# Patient Record
Sex: Female | Born: 1955 | Race: White | Hispanic: No | Marital: Single | Smoking: Former smoker
Health system: Southern US, Community
[De-identification: ages and names within clinical notes are randomized; demographics above are authoritative.]

## PROBLEM LIST (undated history)

## (undated) DIAGNOSIS — I252 Old myocardial infarction: Secondary | ICD-10-CM

## (undated) DIAGNOSIS — E785 Hyperlipidemia, unspecified: Secondary | ICD-10-CM

## (undated) HISTORY — PX: APPENDECTOMY: SHX54

## (undated) HISTORY — DX: Old myocardial infarction: I25.2

## (undated) HISTORY — DX: Hyperlipidemia, unspecified: E78.5

## (undated) HISTORY — PX: ABDOMINAL HYSTERECTOMY: SHX81

---

## 2010-10-23 DIAGNOSIS — I252 Old myocardial infarction: Secondary | ICD-10-CM

## 2010-10-23 HISTORY — DX: Old myocardial infarction: I25.2

## 2014-09-28 ENCOUNTER — Encounter: Payer: Self-pay | Admitting: Physician Assistant

## 2014-09-28 ENCOUNTER — Ambulatory Visit (INDEPENDENT_AMBULATORY_CARE_PROVIDER_SITE_OTHER): Payer: BC Managed Care – PPO | Admitting: Physician Assistant

## 2014-09-28 VITALS — BP 112/75 | HR 85 | Temp 98.2°F | Ht 65.4 in | Wt 163.0 lb

## 2014-09-28 DIAGNOSIS — L989 Disorder of the skin and subcutaneous tissue, unspecified: Secondary | ICD-10-CM

## 2014-09-28 DIAGNOSIS — Z1239 Encounter for other screening for malignant neoplasm of breast: Secondary | ICD-10-CM

## 2014-09-28 DIAGNOSIS — E785 Hyperlipidemia, unspecified: Secondary | ICD-10-CM

## 2014-09-28 DIAGNOSIS — I252 Old myocardial infarction: Secondary | ICD-10-CM

## 2014-09-28 NOTE — Progress Notes (Signed)
    Patient presents to clinic today to establish care.  Acute Concerns: Patient complains of a skin lesion of upper back that has been present for several months.  Is not painful but will not heal. Endorses prior hx of skin cancer, although unsure what type.   Health Maintenance: Immunizations -- Declines flu shot. Colonoscopy -- Has not had.  Declines at present. Mammogram -- History of mass.  Overdue for follow-up mammogram.  Will need order and records from previous PCP. PAP -- s/p TAH  Past Medical History  Diagnosis Date  . MI, old 2012  . Hyperlipidemia     Past Surgical History  Procedure Laterality Date  . Abdominal hysterectomy    . Appendectomy      No current outpatient prescriptions on file prior to visit.   No current facility-administered medications on file prior to visit.    Allergies  Allergen Reactions  . Penicillins Hives    Family History  Problem Relation Age of Onset  . Uterine cancer Mother   . Hypertension Mother   . Hypertension Father     History   Social History  . Marital Status: Single    Spouse Name: N/A    Number of Children: N/A  . Years of Education: N/A   Occupational History  . Not on file.   Social History Main Topics  . Smoking status: Former Smoker -- 2.50 packs/day for 30 years    Types: Cigarettes    Quit date: 09/28/2001  . Smokeless tobacco: Never Used  . Alcohol Use: 1.2 oz/week    2 Not specified per week  . Drug Use: No  . Sexual Activity:    Partners: Male   Other Topics Concern  . Not on file   Social History Narrative   ROS Pertinent ROS are listed in HPI.  BP 112/75 mmHg  Pulse 85  Temp(Src) 98.2 F (36.8 C)  Ht 5' 5.4" (1.661 m)  Wt 163 lb (73.936 kg)  BMI 26.80 kg/m2  SpO2 97%  Physical Exam  Constitutional: She is oriented to person, place, and time and well-developed, well-nourished, and in no distress.  HENT:  Head: Normocephalic and atraumatic.  Eyes: Conjunctivae are normal.    Cardiovascular: Normal rate, regular rhythm, normal heart sounds and intact distal pulses.   Pulmonary/Chest: Effort normal and breath sounds normal. No respiratory distress. She has no wheezes. She has no rales. She exhibits no tenderness.  Neurological: She is alert and oriented to person, place, and time.  Skin: Skin is warm and dry.     Psychiatric: Affect normal.  Vitals reviewed.    Assessment/Plan: Skin lesion of back Concern for malignancy.  Will refer to Dermatology for biopsy and assessment.  Breast cancer screening History of lump of left breast, non-cancerous. Need previous imaging records so we can proceed with Diagnostic Mammogram.  Medical record request form filled and faxed.  Hyperlipidemia Patient-endorses history. States is controlled with diet.  S/p MI.  Start 81 mg ASA daily.  Return for CPE with fasting labs so lipids can be assessed.  History of MI (myocardial infarction) Begin 81 mg asa.  Patient to return for fasting labs to assess risks for repeat MI.  Will obtain records from Cardiology.

## 2014-09-28 NOTE — Progress Notes (Signed)
Pre visit review using our clinic review tool, if applicable. No additional management support is needed unless otherwise documented below in the visit note. 

## 2014-09-28 NOTE — Patient Instructions (Signed)
You will be contacted by Dermatology for an appointment.  I am trying to get you in with Dr. Charm BargesButler whom your husband sees.  You will also be contacted for a diagnostic mammogram.  We will need previous imaging records before [facility] will perform testing.  I will call you as soon as everything has been received and sent over.   Please start an 81 mg aspirin (baby aspirin) daily.  Please return in 1 month for a complete physical with fasting labs.

## 2014-09-30 ENCOUNTER — Encounter: Payer: Self-pay | Admitting: Physician Assistant

## 2014-09-30 DIAGNOSIS — Z1239 Encounter for other screening for malignant neoplasm of breast: Secondary | ICD-10-CM | POA: Insufficient documentation

## 2014-09-30 DIAGNOSIS — E785 Hyperlipidemia, unspecified: Secondary | ICD-10-CM | POA: Insufficient documentation

## 2014-09-30 DIAGNOSIS — I252 Old myocardial infarction: Secondary | ICD-10-CM | POA: Insufficient documentation

## 2014-09-30 DIAGNOSIS — L989 Disorder of the skin and subcutaneous tissue, unspecified: Secondary | ICD-10-CM | POA: Insufficient documentation

## 2014-09-30 NOTE — Assessment & Plan Note (Signed)
Patient-endorses history. States is controlled with diet.  S/p MI.  Start 81 mg ASA daily.  Return for CPE with fasting labs so lipids can be assessed.

## 2014-09-30 NOTE — Assessment & Plan Note (Signed)
Concern for malignancy.  Will refer to Dermatology for biopsy and assessment.

## 2014-09-30 NOTE — Assessment & Plan Note (Signed)
History of lump of left breast, non-cancerous. Need previous imaging records so we can proceed with Diagnostic Mammogram.  Medical record request form filled and faxed.

## 2014-09-30 NOTE — Assessment & Plan Note (Signed)
Begin 81 mg asa.  Patient to return for fasting labs to assess risks for repeat MI.  Will obtain records from Cardiology.

## 2014-11-23 ENCOUNTER — Ambulatory Visit (INDEPENDENT_AMBULATORY_CARE_PROVIDER_SITE_OTHER): Payer: BLUE CROSS/BLUE SHIELD | Admitting: Medical

## 2014-11-23 ENCOUNTER — Encounter: Payer: Self-pay | Admitting: Medical

## 2014-11-23 ENCOUNTER — Ambulatory Visit (HOSPITAL_BASED_OUTPATIENT_CLINIC_OR_DEPARTMENT_OTHER)
Admission: RE | Admit: 2014-11-23 | Discharge: 2014-11-23 | Disposition: A | Discharge status: Home / Self Care | Payer: BLUE CROSS/BLUE SHIELD | Source: Ambulatory Visit | Attending: Medical | Admitting: Medical

## 2014-11-23 VITALS — BP 130/88 | HR 86 | Temp 97.9°F | Ht 65.4 in | Wt 167.8 lb

## 2014-11-23 DIAGNOSIS — R0781 Pleurodynia: Secondary | ICD-10-CM | POA: Insufficient documentation

## 2014-11-23 DIAGNOSIS — Y939 Activity, unspecified: Secondary | ICD-10-CM | POA: Diagnosis not present

## 2014-11-23 MED ORDER — TRAMADOL HCL 50 MG PO TABS: 50.0000 mg | ORAL_TABLET | Freq: Three times a day (TID) | ORAL | Status: DC | PRN

## 2014-11-23 MED ORDER — DICLOFENAC SODIUM 75 MG PO TBEC: 75.0000 mg | DELAYED_RELEASE_TABLET | Freq: Two times a day (BID) | ORAL | Status: DC

## 2014-11-23 NOTE — Progress Notes (Signed)
Pre visit review using our clinic review tool, if applicable. No additional management support is needed unless otherwise documented below in the visit note. 

## 2014-11-23 NOTE — Patient Instructions (Addendum)
Your rib pain likely represent injury to the intercostal muscle strain but possible rib injury. Please get xray to day. Rx diclofenac and tramadol. I would advise reporting this injury and letting me fill out your modified work duty sheet so this length of this injury can be minimized and you can recover quickly.  Follow up in 10 days or as needed

## 2014-11-23 NOTE — Progress Notes (Signed)
   Subjective:    Patient ID: Victoria Waller, female    DOB: 1955-12-26, 59 y.o.   MRN: [id number]  HPI   Pt in with some rt rib region pain. She states she was moving large piece of carpet. She pulled it at work pulling the large piece of carpet.  Pt was informed to report it to workers comp. Benefits of doing do so and complications if she were to not report it.  At time she pulled carpet felt pop/tear sensation in rt side rib area.  Pt pain level at work is level 3/10. But when she moves certain ways pain is more severe   Review of Systems  Constitutional: Negative for fever, chills and fatigue.  Respiratory: Negative for cough, chest tightness, shortness of breath and wheezing.   Cardiovascular: Negative for chest pain.  Musculoskeletal:       Rt rib pain on movement. Pain on breathing.  Neurological: Negative for headaches.  Hematological: Negative for adenopathy. Bruises/bleeds easily.  Psychiatric/Behavioral: Negative for behavioral problems.   Past Medical History  Diagnosis Date  . MI, old 2012  . Hyperlipidemia     History   Social History  . Marital Status: Single    Spouse Name: N/A    Number of Children: N/A  . Years of Education: N/A   Occupational History  . Not on file.   Social History Main Topics  . Smoking status: Former Smoker -- 2.50 packs/day for 30 years    Types: Cigarettes    Quit date: 09/28/2001  . Smokeless tobacco: Never Used  . Alcohol Use: 1.2 oz/week    2 Not specified per week  . Drug Use: No  . Sexual Activity:    Partners: Male   Other Topics Concern  . Not on file   Social History Narrative    Past Surgical History  Procedure Laterality Date  . Abdominal hysterectomy    . Appendectomy      Family History  Problem Relation Age of Onset  . Uterine cancer Mother   . Hypertension Mother   . Hypertension Father     Allergies  Allergen Reactions  . Penicillins Hives    Current Outpatient Prescriptions on File Prior  to Visit  Medication Sig Dispense Refill  . aspirin 81 MG chewable tablet Chew by mouth daily.     No current facility-administered medications on file prior to visit.    BP 130/88 mmHg  Pulse 86  Temp(Src) 97.9 F (36.6 C) (Oral)  Ht 5' 5.4" (1.661 m)  Wt 167 lb 12.8 oz (76.114 kg)  BMI 27.59 kg/m2  SpO2 96%       Objective:   Physical Exam    General- No acute distress. Pleasant patient. Neck- Full range of motion, no jvd Lungs- Clear, even and unlabored. Heart- regular rate and rhythm. Neurologic- CNII- XII grossly intact. Thorax- Rt lower ribs, mid axillary region pain on palpation and on deep inspiration. NO bruising.        Assessment & Plan:  Front staff notified regarding pt injury taking place at work.

## 2014-11-23 NOTE — Assessment & Plan Note (Signed)
Your rib pain likely represent injury to the intercostal muscle strain but possible rib injury. Please get xray to day. Rx diclofenac and tramadol. I would advise reporting this injury and letting me fill out your modified work duty sheet so this length of this injury can be minimized and you can recover quickly.

## 2015-07-04 ENCOUNTER — Emergency Department (HOSPITAL_BASED_OUTPATIENT_CLINIC_OR_DEPARTMENT_OTHER): Payer: BLUE CROSS/BLUE SHIELD

## 2015-07-04 ENCOUNTER — Emergency Department (HOSPITAL_BASED_OUTPATIENT_CLINIC_OR_DEPARTMENT_OTHER)
Admission: EM | Admit: 2015-07-04 | Discharge: 2015-07-04 | Disposition: A | Discharge status: Home / Self Care | Payer: BLUE CROSS/BLUE SHIELD | Attending: Emergency Medicine | Admitting: Emergency Medicine

## 2015-07-04 ENCOUNTER — Encounter (HOSPITAL_BASED_OUTPATIENT_CLINIC_OR_DEPARTMENT_OTHER): Payer: Self-pay

## 2015-07-04 DIAGNOSIS — Y9289 Other specified places as the place of occurrence of the external cause: Secondary | ICD-10-CM | POA: Diagnosis not present

## 2015-07-04 DIAGNOSIS — Z87891 Personal history of nicotine dependence: Secondary | ICD-10-CM | POA: Diagnosis not present

## 2015-07-04 DIAGNOSIS — S8991XA Unspecified injury of right lower leg, initial encounter: Secondary | ICD-10-CM | POA: Insufficient documentation

## 2015-07-04 DIAGNOSIS — Z7982 Long term (current) use of aspirin: Secondary | ICD-10-CM | POA: Diagnosis not present

## 2015-07-04 DIAGNOSIS — Y998 Other external cause status: Secondary | ICD-10-CM | POA: Insufficient documentation

## 2015-07-04 DIAGNOSIS — I252 Old myocardial infarction: Secondary | ICD-10-CM | POA: Diagnosis not present

## 2015-07-04 DIAGNOSIS — W01198A Fall on same level from slipping, tripping and stumbling with subsequent striking against other object, initial encounter: Secondary | ICD-10-CM | POA: Insufficient documentation

## 2015-07-04 DIAGNOSIS — S79911A Unspecified injury of right hip, initial encounter: Secondary | ICD-10-CM | POA: Diagnosis not present

## 2015-07-04 DIAGNOSIS — Y9389 Activity, other specified: Secondary | ICD-10-CM | POA: Insufficient documentation

## 2015-07-04 DIAGNOSIS — Z8639 Personal history of other endocrine, nutritional and metabolic disease: Secondary | ICD-10-CM | POA: Insufficient documentation

## 2015-07-04 DIAGNOSIS — Z880 Allergy status to penicillin: Secondary | ICD-10-CM | POA: Insufficient documentation

## 2015-07-04 DIAGNOSIS — M25551 Pain in right hip: Secondary | ICD-10-CM

## 2015-07-04 DIAGNOSIS — S3991XA Unspecified injury of abdomen, initial encounter: Secondary | ICD-10-CM | POA: Diagnosis not present

## 2015-07-04 DIAGNOSIS — W19XXXA Unspecified fall, initial encounter: Secondary | ICD-10-CM

## 2015-07-04 MED ORDER — OXYCODONE-ACETAMINOPHEN 5-325 MG PO TABS
1.0000 | ORAL_TABLET | Freq: Once | ORAL | Status: DC
  Filled 2015-07-04: qty 1

## 2015-07-04 MED ORDER — HYDROCODONE-ACETAMINOPHEN 5-325 MG PO TABS: 1.0000 | ORAL_TABLET | ORAL | Status: DC | PRN

## 2015-07-04 MED ORDER — IBUPROFEN 600 MG PO TABS: 600.0000 mg | ORAL_TABLET | Freq: Three times a day (TID) | ORAL | Status: AC | PRN

## 2015-07-04 MED ORDER — IBUPROFEN 800 MG PO TABS
800.0000 mg | ORAL_TABLET | Freq: Once | ORAL | Status: AC
  Administered 2015-07-04: 800 mg via ORAL
  Filled 2015-07-04: qty 1

## 2015-07-04 NOTE — ED Notes (Signed)
Pt states she has been ambulating around the room without difficulty and is ready for discharge.

## 2015-07-04 NOTE — ED Notes (Signed)
rx x 2 given for vicodin and ibuprofen

## 2015-07-04 NOTE — ED Provider Notes (Signed)
CSN: [id number]     Arrival date & time 07/04/15  4098 History   First MD Initiated Contact with Patient 07/04/15 [phone]     Chief Complaint  Patient presents with  . Fall      HPI Patient presents to the emergency department after a fall today.  She tripped and fell forward.  She struck her right knee and right hip.  She reports pain in her right knee, right femur, right hip, right groin.  No head injury.  Denies neck pain.  Her pain is mild in severity.  She does not want anything for pain except for anti-inflammatories.  She denies chest pain shortness of breath.  She denies wrist pain.  Her pain is mild to moderate in severity at this time.  She feels better when she standing.   Past Medical History  Diagnosis Date  . MI, old 2012  . Hyperlipidemia    Past Surgical History  Procedure Laterality Date  . Abdominal hysterectomy    . Appendectomy     Family History  Problem Relation Age of Onset  . Uterine cancer Mother   . Hypertension Mother   . Hypertension Father    Social History  Substance Use Topics  . Smoking status: Former Smoker -- 2.50 packs/day for 30 years    Types: Cigarettes    Quit date: 09/28/2001  . Smokeless tobacco: Never Used  . Alcohol Use: 1.2 oz/week    2 Standard drinks or equivalent per week   OB History    No data available     Review of Systems  All other systems reviewed and are negative.     Allergies  Penicillins  Home Medications   Prior to Admission medications   Medication Sig Start Date End Date Taking? Authorizing Provider  aspirin 81 MG chewable tablet Chew by mouth daily. 09/30/14   Waldon Merl, PA-C   BP 114/67 mmHg  Pulse 60  Temp(Src) 98.3 F (36.8 C) (Oral)  Resp 18  Ht 5\' 6"  (1.676 m)  Wt 167 lb (75.751 kg)  BMI 26.97 kg/m2  SpO2 100% Physical Exam  Constitutional: She is oriented to person, place, and time. She appears well-developed and well-nourished. No distress.  HENT:  Head: Normocephalic and  atraumatic.  Eyes: EOM are normal.  Neck: Normal range of motion.  Cardiovascular: Normal rate, regular rhythm and normal heart sounds.   Pulmonary/Chest: Effort normal and breath sounds normal.  Abdominal: Soft. She exhibits no distension. There is no tenderness.  Musculoskeletal: Normal range of motion.  Pelvis is stable.  Normal pulses in right foot.  Normal range of motion of right knee and right ankle.  She does have tenderness of the lateral joint line on the right knee.  There is a small abrasion overlying the right anterior knee.  Patient without obvious deformity of the right lower extremity.  She does have pain is a mid shaft femur region without deformity or ecchymosis.  No lacerations.  She is able to arrange her right hip although she does have pain with internal/external rotation.  There is no shortening or rotation of the right lower extremity as compared to the left.  Neurological: She is alert and oriented to person, place, and time.  Skin: Skin is warm and dry.  Psychiatric: She has a normal mood and affect. Judgment normal.  Nursing note and vitals reviewed.   ED Course  Procedures (including critical care time) Labs Review Labs Reviewed - No data to display  Imaging Review Dg Knee Complete 4 Views Right  07/04/2015   CLINICAL DATA:  Fall, right knee pain  EXAM: RIGHT KNEE - COMPLETE 4+ VIEW  COMPARISON:  None.  FINDINGS: There is no evidence of fracture, dislocation, or joint effusion. There is no evidence of arthropathy or other focal bone abnormality. Soft tissues are unremarkable. Mild tricompartmental spurring. Bones are subjectively osteopenic.  IMPRESSION: No acute abnormality.   Electronically Signed   By: Christiana Pellant M.D.   On: 07/04/2015 10:00   Dg Hip Unilat With Pelvis 2-3 Views Right  07/04/2015   CLINICAL DATA:  Slipped and fell in driveway.  Pain.  EXAM: DG HIP (WITH OR WITHOUT PELVIS) 2-3V RIGHT  COMPARISON:  None.  FINDINGS: There is no evidence of hip  fracture or dislocation. There is no evidence of arthropathy or other focal bone abnormality.  IMPRESSION: Negative.   Electronically Signed   By: Elsie Stain M.D.   On: 07/04/2015 09:29   Dg Femur, Min 2 Views Right  07/04/2015   CLINICAL DATA:  Fall, right leg pain  EXAM: RIGHT FEMUR 2 VIEWS  COMPARISON:  None.  FINDINGS: There is no evidence of fracture or other focal bone lesions. Soft tissues are unremarkable. Bones are subjectively osteopenic.  IMPRESSION: Negative.   Electronically Signed   By: Christiana Pellant M.D.   On: 07/04/2015 10:00   I have personally reviewed and evaluated these images and lab results as part of my medical decision-making.   EKG Interpretation None      MDM   Final diagnoses:  Fall    11:34 AM Patient feels better at this time.  She can ambulate however she has ongoing right hip and right groin pain.  I suspect the patient may have a occult nondisplaced pubic rami fracture given her pain.  At this time she is unwilling to undergo CT imaging of her pelvis and right hip for further evaluation.  She feels like it is just bruised and would prefer to go home and trialed this with anti-inflammatories and pain medications over the next 24-48 hours.  I've instructed her to follow-up with orthopedic surgery.  I would prefer the patient get a CT scan at this time but the patient does not want one.  She understands that if she were to change her mind she should come back to emergency department for further evaluation.    Azalia Bilis, MD 07/04/15 1136

## 2015-07-04 NOTE — ED Notes (Signed)
Patient transported to X-ray and returned 

## 2015-07-04 NOTE — ED Notes (Addendum)
Patient here with right hip and leg pain after slipping outside this am landing on right side, no loc. Reports that she slipped on acorns landing on cement walk. Increased pain in supine or sitting position, feels better when standing.

## 2015-08-07 ENCOUNTER — Ambulatory Visit: Payer: BLUE CROSS/BLUE SHIELD | Admitting: Family Medicine

## 2015-10-22 ENCOUNTER — Ambulatory Visit (INDEPENDENT_AMBULATORY_CARE_PROVIDER_SITE_OTHER): Payer: BLUE CROSS/BLUE SHIELD | Admitting: Family Medicine

## 2015-10-22 ENCOUNTER — Encounter: Payer: Self-pay | Admitting: Family Medicine

## 2015-10-22 VITALS — BP 100/72 | HR 96 | Temp 98.1°F | Ht 66.0 in | Wt 165.4 lb

## 2015-10-22 DIAGNOSIS — J0100 Acute maxillary sinusitis, unspecified: Secondary | ICD-10-CM

## 2015-10-22 MED ORDER — DOXYCYCLINE HYCLATE 100 MG PO TABS: 100.0000 mg | ORAL_TABLET | Freq: Two times a day (BID) | ORAL | Status: AC

## 2015-10-22 MED ORDER — BENZONATATE 100 MG PO CAPS: 100.0000 mg | ORAL_CAPSULE | Freq: Three times a day (TID) | ORAL | Status: AC | PRN

## 2015-10-22 NOTE — Progress Notes (Signed)
Pre visit review using our clinic review tool, if applicable. No additional management support is needed unless otherwise documented below in the visit note. 

## 2015-10-22 NOTE — Progress Notes (Signed)
HPI:  URI: -started: about 4 weeks ago with a cold, then worse the last 2 weeks -symptoms:thick nasal congestion, sore throat, cough, PND, paranasal sinus pressure and discomfort, low grade fever a few weeks ago -denies:fever recently, SOB, NVD, tooth pain -has tried: OTC cough and cold treatments -sick contacts/travel/risks: denies flu exposure, tick exposure or or Ebola risks  ROS: See pertinent positives and negatives per HPI.  Past Medical History  Diagnosis Date  . MI, old 2012  . Hyperlipidemia     Past Surgical History  Procedure Laterality Date  . Abdominal hysterectomy    . Appendectomy      Family History  Problem Relation Age of Onset  . Uterine cancer Mother   . Hypertension Mother   . Hypertension Father     Social History   Social History  . Marital Status: Single    Spouse Name: N/A  . Number of Children: N/A  . Years of Education: N/A   Social History Main Topics  . Smoking status: Former Smoker -- 2.50 packs/day for 30 years    Types: Cigarettes    Quit date: 09/28/2001  . Smokeless tobacco: Never Used  . Alcohol Use: 1.2 oz/week    2 Standard drinks or equivalent per week  . Drug Use: No  . Sexual Activity:    Partners: Male   Other Topics Concern  . None   Social History Narrative     Current outpatient prescriptions:  .  aspirin 81 MG chewable tablet, Chew by mouth daily., Disp: , Rfl:  .  ibuprofen (ADVIL,MOTRIN) 600 MG tablet, Take 1 tablet (600 mg total) by mouth every 8 (eight) hours as needed., Disp: 15 tablet, Rfl: 0 .  benzonatate (TESSALON PERLES) 100 MG capsule, Take 1 capsule (100 mg total) by mouth 3 (three) times daily as needed for cough., Disp: 20 capsule, Rfl: 0 .  doxycycline (VIBRA-TABS) 100 MG tablet, Take 1 tablet (100 mg total) by mouth 2 (two) times daily., Disp: 20 tablet, Rfl: 0  EXAM:  Filed Vitals:   10/22/15 1016  BP: 100/72  Pulse: 96  Temp: 98.1 F (36.7 C)    Body mass index is 26.71  kg/(m^2).  GENERAL: vitals reviewed and listed above, alert, oriented, appears well hydrated and in no acute distress  HEENT: atraumatic, conjunttiva clear, no obvious abnormalities on inspection of external nose and ears, normal appearance of ear canals and TMs, thick nasal congestion R>L, mild post oropharyngeal erythema with PND, no tonsillar edema or exudate, no sinus TTP  NECK: no obvious masses on inspection  LUNGS: clear to auscultation bilaterally, no wheezes, rales or rhonchi, good air movement  CV: HRRR, no peripheral edema  MS: moves all extremities without noticeable abnormality  PSYCH: pleasant and cooperative, no obvious depression or anxiety  ASSESSMENT AND PLAN:  Discussed the following assessment and plan:  Acute maxillary sinusitis, recurrence not specified   We discussed potential etiologies, with developing sinusitis following VURI being most likely. We discussed treatment side effects, likely course, antibiotic misuse, transmission, and signs of developing a serious illness. -of course, we advised to return or notify a doctor immediately if symptoms worsen or persist or new concerns arise.    Patient Instructions  BEFORE YOU LEAVE: -do you want your flu shot? -schedule a physical exam with your regular provider  Please take the antibiotic for the respiratory infection.  Please use the cough medication as instructed if needed.  Follow up with your doctor promptly if worsening, not  improving after treatment or other concerns.     Kriste Basque R.

## 2015-10-22 NOTE — Patient Instructions (Signed)
BEFORE YOU LEAVE: -do you want your flu shot? -schedule a physical exam with your regular provider  Please take the antibiotic for the respiratory infection.  Please use the cough medication as instructed if needed.  Follow up with your doctor promptly if worsening, not improving after treatment or other concerns.

## 2016-03-27 ENCOUNTER — Other Ambulatory Visit: Payer: Self-pay | Admitting: Physician Assistant

## 2016-03-27 DIAGNOSIS — Z1231 Encounter for screening mammogram for malignant neoplasm of breast: Secondary | ICD-10-CM

## 2016-04-14 ENCOUNTER — Ambulatory Visit
Admission: RE | Admit: 2016-04-14 | Discharge: 2016-04-14 | Disposition: A | Discharge status: Home / Self Care | Payer: BLUE CROSS/BLUE SHIELD | Source: Ambulatory Visit | Attending: Physician Assistant | Admitting: Physician Assistant

## 2016-04-14 DIAGNOSIS — Z1231 Encounter for screening mammogram for malignant neoplasm of breast: Secondary | ICD-10-CM

## 2017-03-08 IMAGING — CR DG FEMUR 2+V*R*
4 series · 4 of 4 positions shown · non-contrast
Comparison: None.

CLINICAL DATA: Fall, right leg pain

EXAM:
RIGHT FEMUR 2 VIEWS

[t femur with hip  ap right]
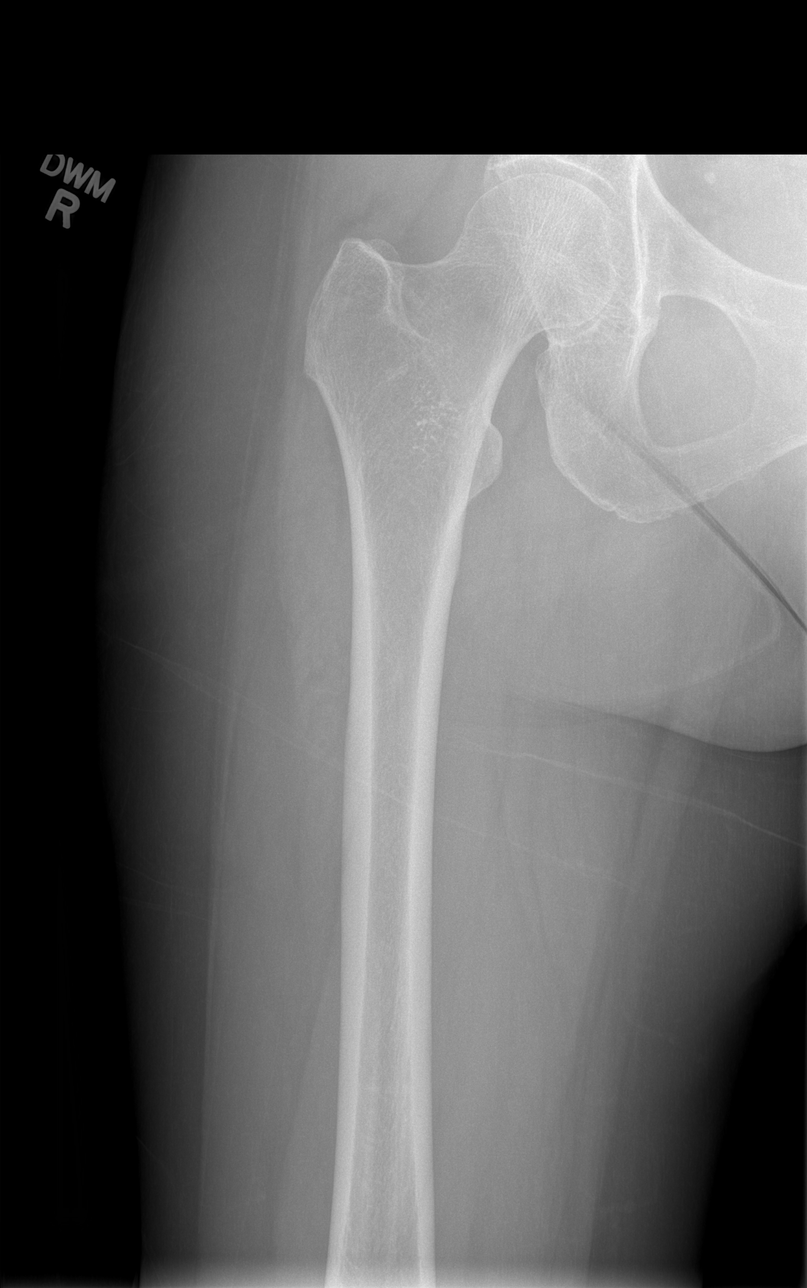

[t femur with knee ap right]
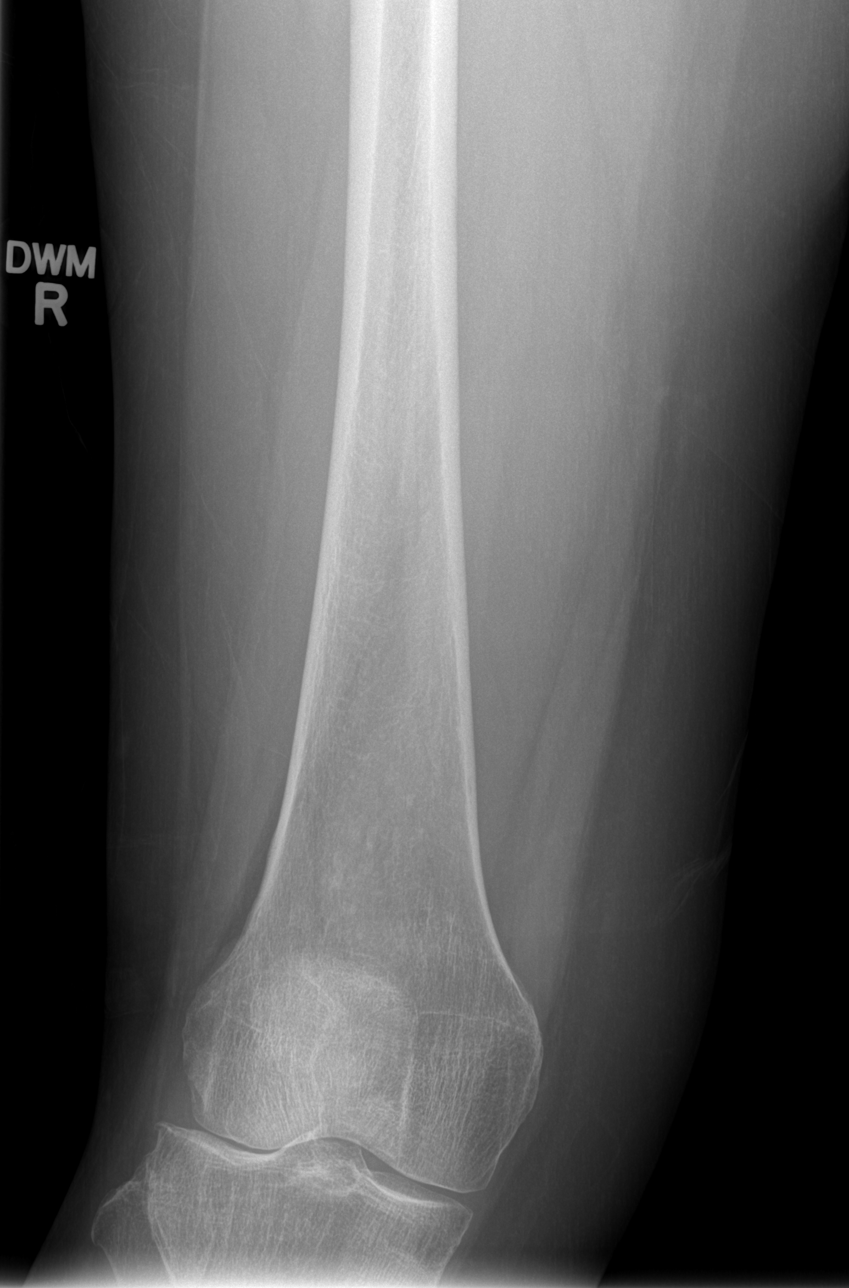

[t femur with hip lat right]
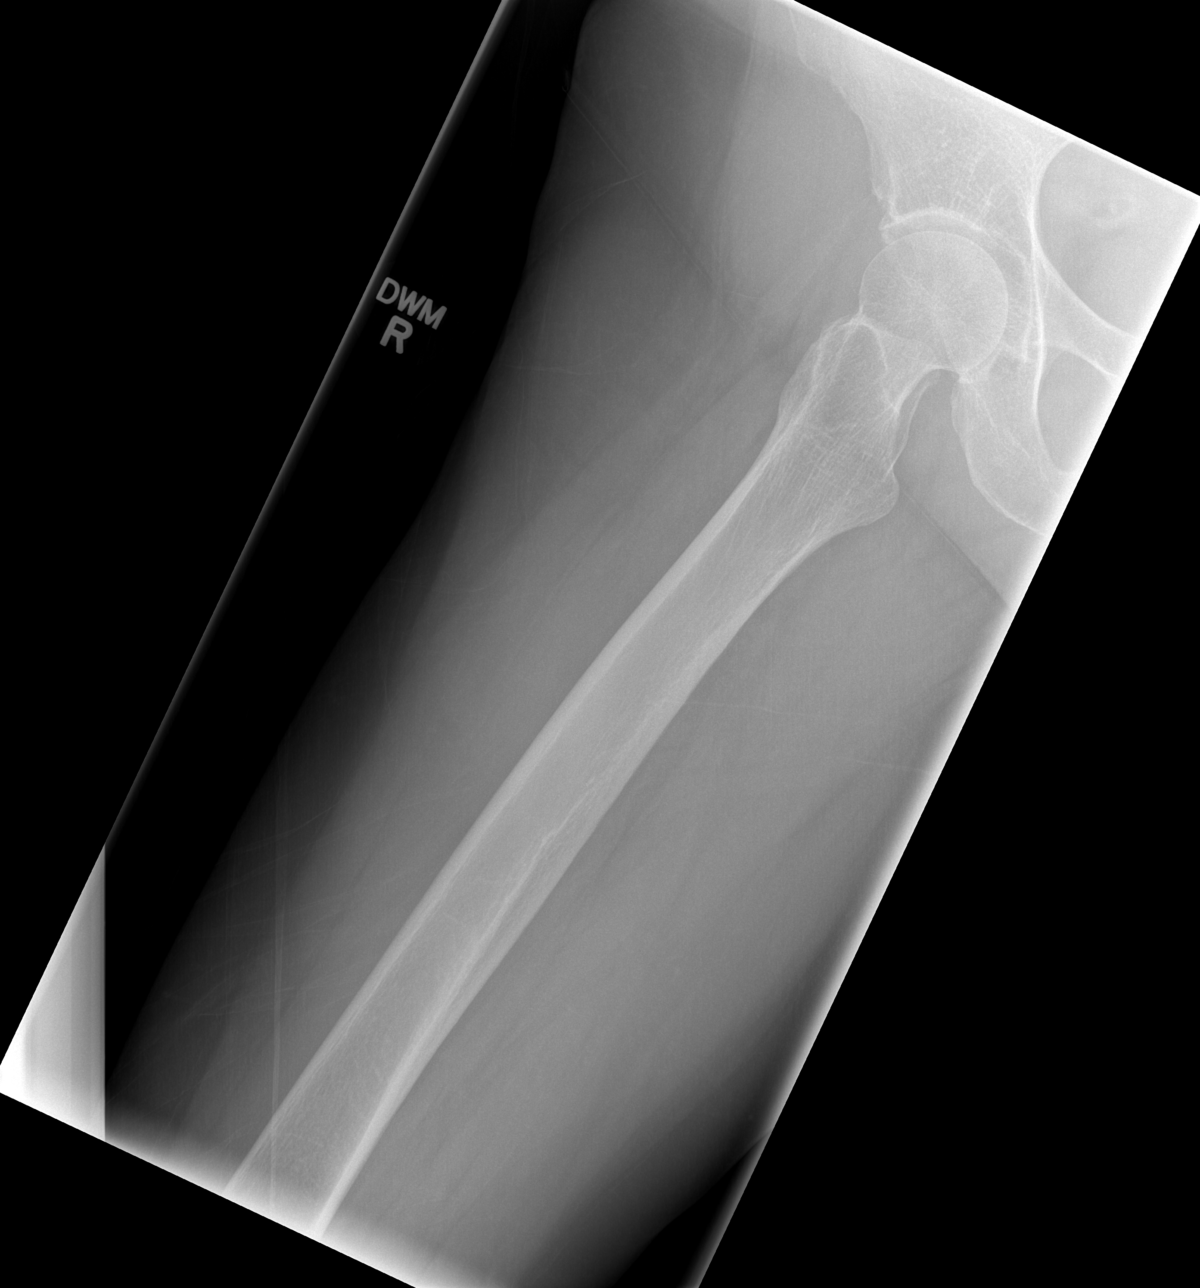

[t femur with knee lat right]
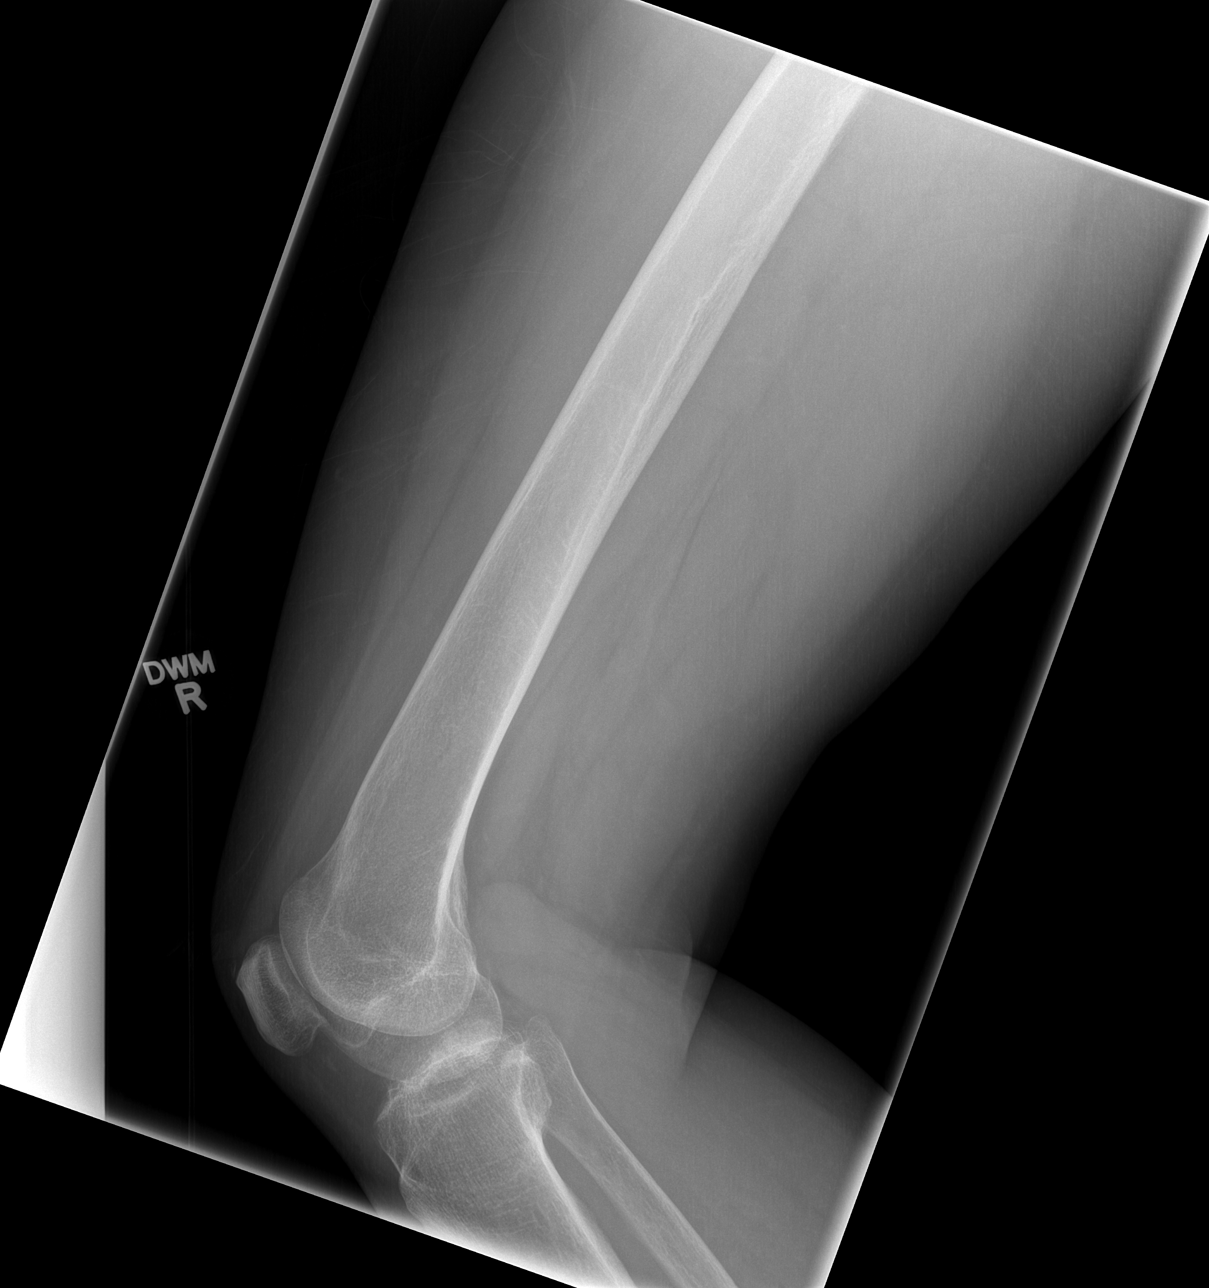

[4 of 4 positions shown; findings below may reference images not displayed]

FINDINGS: There is no evidence of fracture or other focal bone lesions. Soft
tissues are unremarkable. Bones are subjectively osteopenic.
IMPRESSION: Negative.

## 2017-03-08 IMAGING — CR DG HIP (WITH OR WITHOUT PELVIS) 2-3V*R*
3 series · 3 of 3 positions shown · non-contrast
Comparison: None.

CLINICAL DATA: Slipped and fell in driveway.  Pain.

EXAM:
DG HIP (WITH OR WITHOUT PELVIS) 2-3V RIGHT

[wall pelvis (1 of 2)]
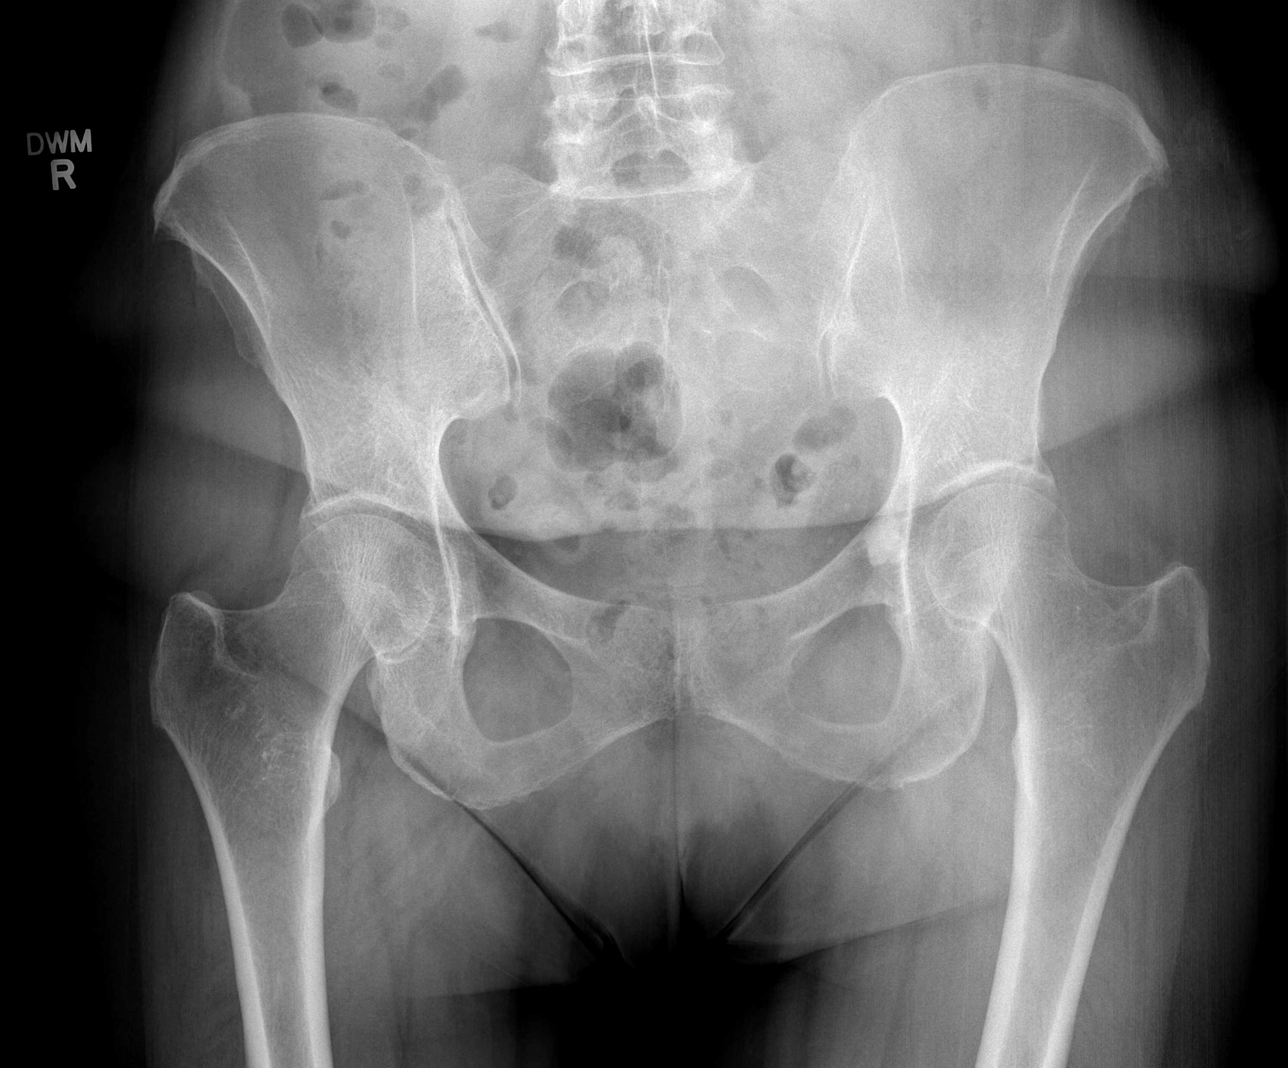

[wall pelvis (2 of 2)]
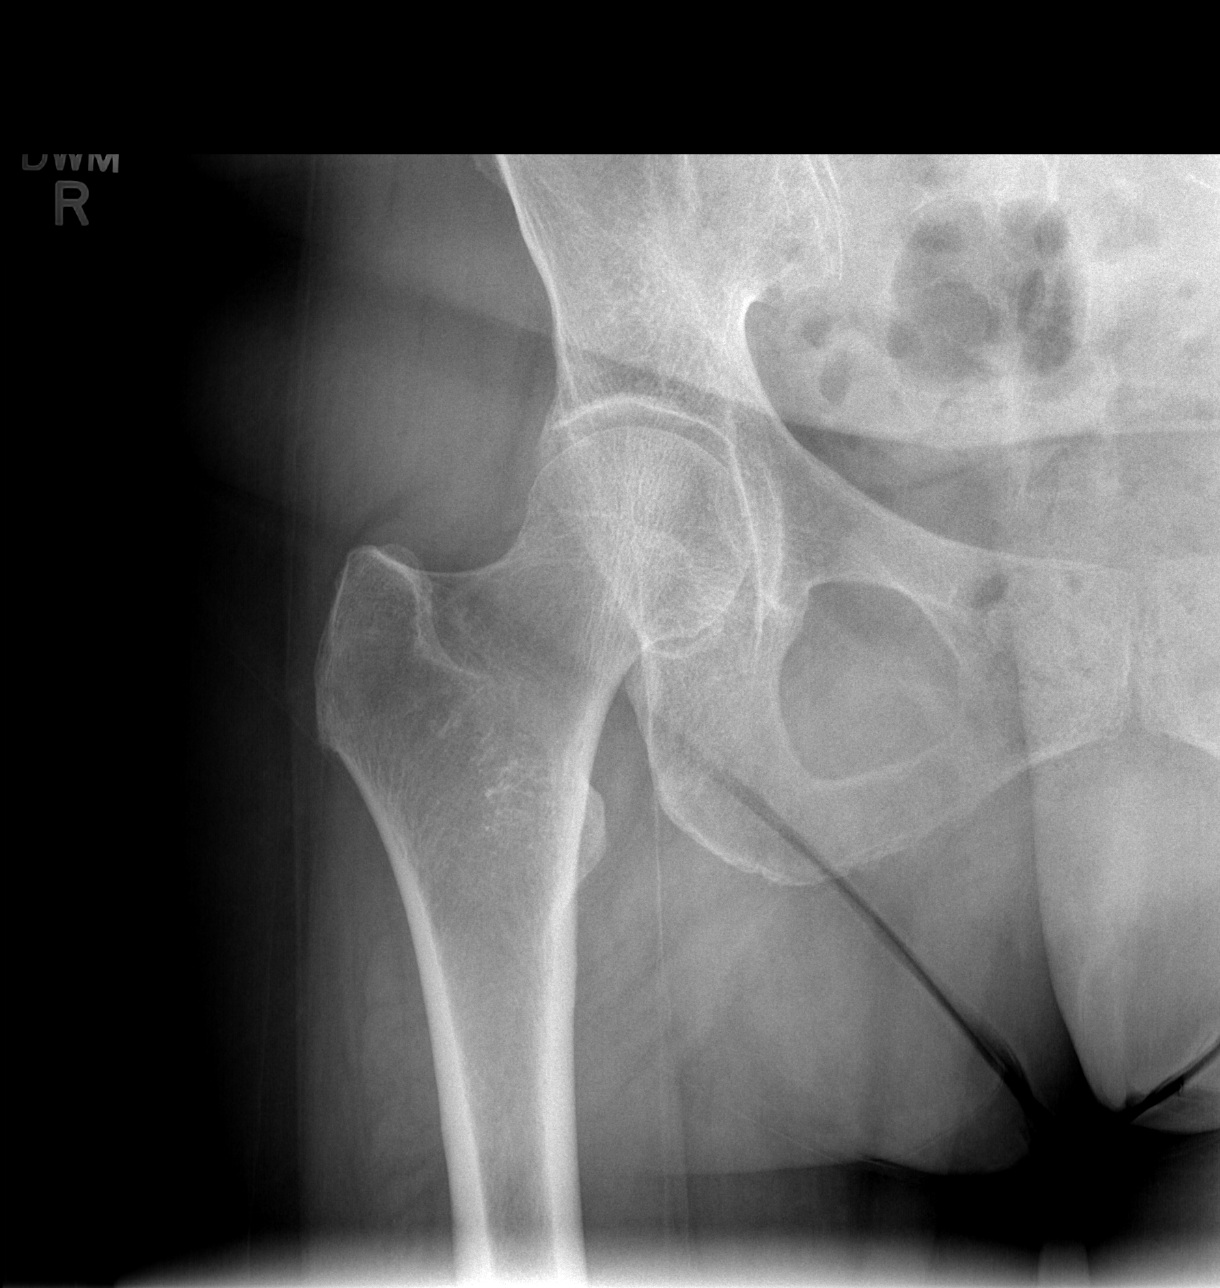

[t hip frog leg right]
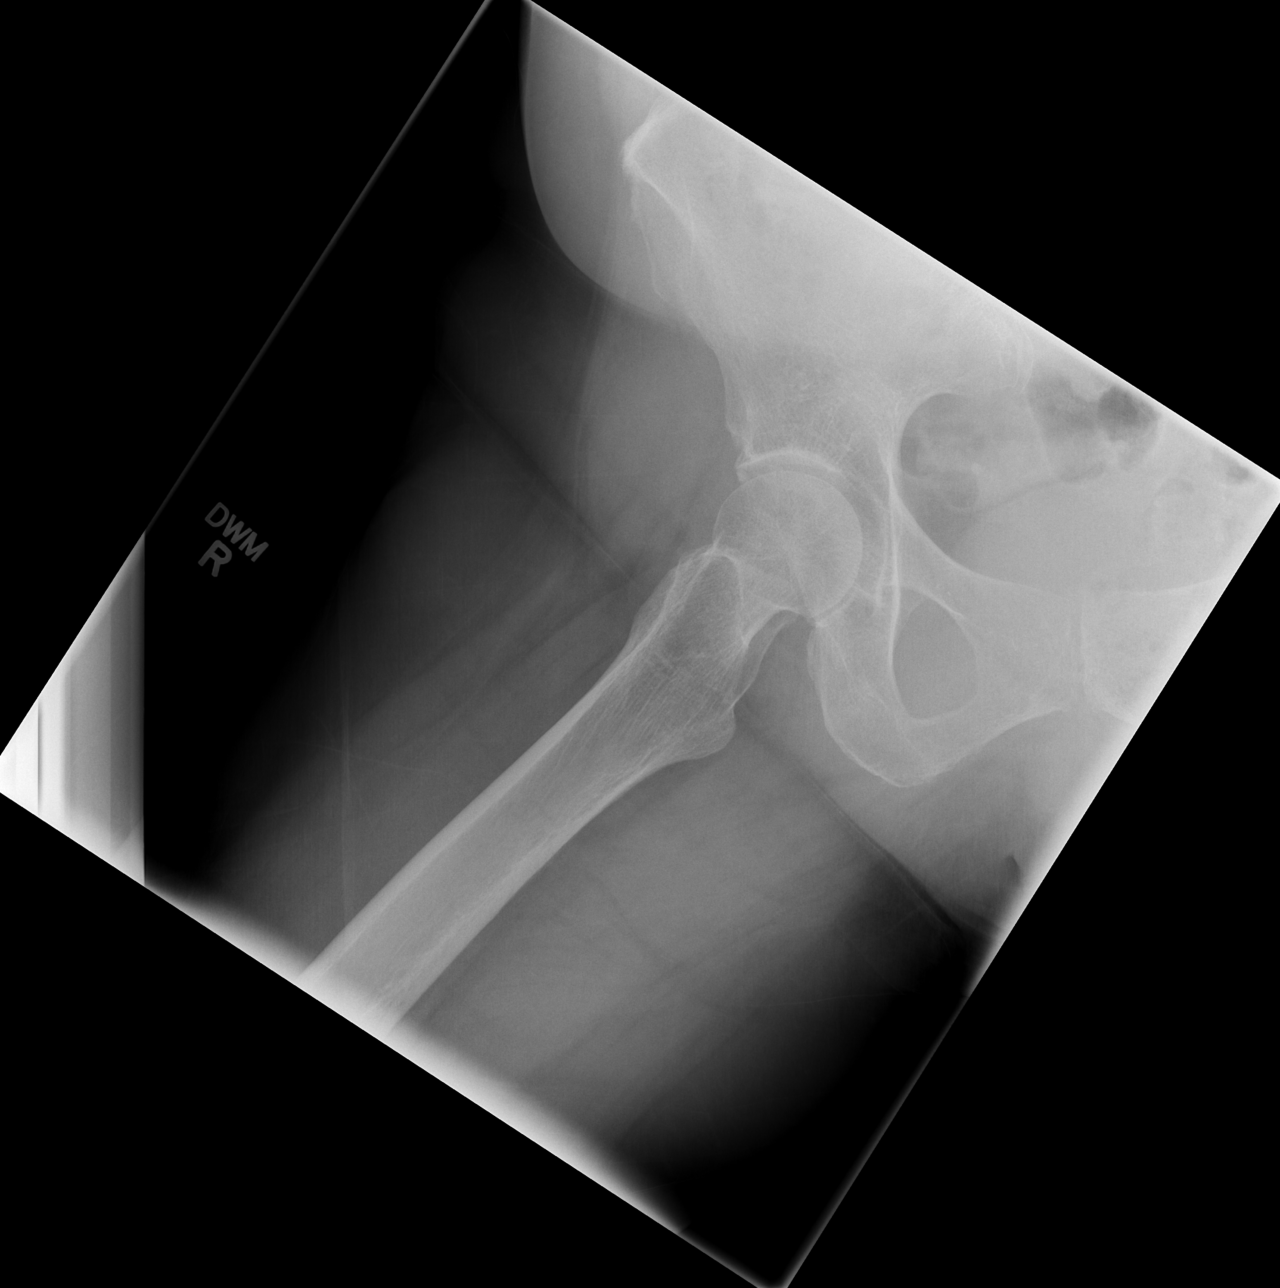

[3 of 3 positions shown; findings below may reference images not displayed]

FINDINGS: There is no evidence of hip fracture or dislocation. There is no
evidence of arthropathy or other focal bone abnormality.
IMPRESSION: Negative.
